# Patient Record
Sex: Male | Born: 1970 | Race: White | Hispanic: No | Marital: Married | Smoking: Never smoker
Health system: Southern US, Community
[De-identification: ages and names within clinical notes are randomized; demographics above are authoritative.]

---

## 2017-02-03 ENCOUNTER — Emergency Department (HOSPITAL_COMMUNITY): Payer: 59

## 2017-02-03 ENCOUNTER — Emergency Department (HOSPITAL_COMMUNITY)
Admission: EM | Admit: 2017-02-03 | Discharge: 2017-02-03 | Disposition: A | Discharge status: Home / Self Care | Payer: 59 | Attending: Emergency Medicine | Admitting: Emergency Medicine

## 2017-02-03 ENCOUNTER — Encounter (HOSPITAL_COMMUNITY): Payer: Self-pay | Admitting: Emergency Medicine

## 2017-02-03 DIAGNOSIS — R1011 Right upper quadrant pain: Secondary | ICD-10-CM | POA: Diagnosis present

## 2017-02-03 DIAGNOSIS — K8020 Calculus of gallbladder without cholecystitis without obstruction: Secondary | ICD-10-CM | POA: Diagnosis not present

## 2017-02-03 DIAGNOSIS — K8050 Calculus of bile duct without cholangitis or cholecystitis without obstruction: Secondary | ICD-10-CM | POA: Insufficient documentation

## 2017-02-03 LAB — CBC WITH DIFFERENTIAL/PLATELET
Basophils Absolute: 0 10*3/uL (ref 0.0–0.1)
Basophils Relative: 0 %
EOS PCT: 2 %
Eosinophils Absolute: 0.2 10*3/uL (ref 0.0–0.7)
HEMATOCRIT: 48.6 % (ref 39.0–52.0)
Hemoglobin: 17 g/dL (ref 13.0–17.0)
LYMPHS ABS: 2.4 10*3/uL (ref 0.7–4.0)
LYMPHS PCT: 25 %
MCH: 31.1 pg (ref 26.0–34.0)
MCHC: 35 g/dL (ref 30.0–36.0)
MCV: 89 fL (ref 78.0–100.0)
MONO ABS: 0.6 10*3/uL (ref 0.1–1.0)
MONOS PCT: 6 %
NEUTROS ABS: 6.5 10*3/uL (ref 1.7–7.7)
Neutrophils Relative %: 67 %
PLATELETS: 270 10*3/uL (ref 150–400)
RBC: 5.46 MIL/uL (ref 4.22–5.81)
RDW: 13.1 % (ref 11.5–15.5)
WBC: 9.6 10*3/uL (ref 4.0–10.5)

## 2017-02-03 LAB — COMPREHENSIVE METABOLIC PANEL
ALBUMIN: 4.5 g/dL (ref 3.5–5.0)
ALK PHOS: 59 U/L (ref 38–126)
ALT: 30 U/L (ref 17–63)
AST: 27 U/L (ref 15–41)
Anion gap: 13 (ref 5–15)
BILIRUBIN TOTAL: 0.6 mg/dL (ref 0.3–1.2)
BUN: 13 mg/dL (ref 6–20)
CHLORIDE: 102 mmol/L (ref 101–111)
CO2: 25 mmol/L (ref 22–32)
Calcium: 9 mg/dL (ref 8.9–10.3)
Creatinine, Ser: 1.03 mg/dL (ref 0.61–1.24)
GFR calc Af Amer: >60 mL/min (ref >60)
GFR calc non Af Amer: >60 mL/min (ref >60)
GLUCOSE: 108 mg/dL — AB (ref 65–99)
Potassium: 3.4 mmol/L — ABNORMAL LOW (ref 3.5–5.1)
SODIUM: 140 mmol/L (ref 135–145)
Total Protein: 7.4 g/dL (ref 6.5–8.1)

## 2017-02-03 LAB — LIPASE, BLOOD: Lipase: 44 U/L (ref 11–51)

## 2017-02-03 MED ORDER — ONDANSETRON 4 MG PO TBDP: 4.0000 mg | ORAL_TABLET | Freq: Three times a day (TID) | ORAL | 0 refills | Status: AC | PRN

## 2017-02-03 MED ORDER — HYDROCODONE-ACETAMINOPHEN 5-325 MG PO TABS: 1.0000 | ORAL_TABLET | ORAL | 0 refills | Status: AC | PRN

## 2017-02-03 MED ORDER — ONDANSETRON HCL 4 MG/2ML IJ SOLN
4.0000 mg | Freq: Once | INTRAMUSCULAR | Status: AC
  Administered 2017-02-03: 4 mg via INTRAVENOUS
  Filled 2017-02-03: qty 2

## 2017-02-03 MED ORDER — MORPHINE SULFATE (PF) 4 MG/ML IV SOLN
8.0000 mg | Freq: Once | INTRAVENOUS | Status: AC
  Administered 2017-02-03: 8 mg via INTRAVENOUS
  Filled 2017-02-03: qty 2

## 2017-02-03 NOTE — ED Provider Notes (Signed)
[facility]-EMERGENCY DEPT Provider Note   CSN: [id number] Arrival date & time: 02/03/17  5621     History   Chief Complaint Chief Complaint  Patient presents with  . Abdominal Pain    HPI Ward Boissonneault is a 46 y.o. male.  HPI  46 year old male presents with right upper quadrant and right-sided back pain. It woke him up about 2 hours ago out of sleep. He vomited twice. No further nausea and the pain is minimally improved. It feels like a tightness in his abdomen. Nothing makes it better or worse. It is not pleuritic. There is no chest pain or shortness of breath. No recent travel, history of DVT/PE, or leg swelling. He's never had issues with his gallbladder before. No other prior abdominal issues. Declines pain medicine at this time. No urinary symptoms. He states it does feel like a kidney stone except that it is not in his lower abdomen like it typically would be.  History reviewed. No pertinent past medical history.  There are no active problems to display for this patient.   History reviewed. No pertinent surgical history.     Home Medications    Prior to Admission medications   Medication Sig Start Date End Date Taking? Authorizing Provider  HYDROcodone-acetaminophen (NORCO) 5-325 MG tablet Take 1 tablet by mouth every 4 (four) hours as needed for severe pain. 02/03/17   Pricilla Loveless, MD  ondansetron (ZOFRAN ODT) 4 MG disintegrating tablet Take 1 tablet (4 mg total) by mouth every 8 (eight) hours as needed for nausea or vomiting. 02/03/17   Pricilla Loveless, MD    Family History History reviewed. No pertinent family history.  Social History Social History  Substance Use Topics  . Smoking status: Never Smoker  . Smokeless tobacco: Never Used  . Alcohol use No     Allergies   Patient has no known allergies.   Review of Systems Review of Systems  Constitutional: Negative for fever.  Respiratory: Negative for shortness of breath.   Cardiovascular: Negative for  chest pain.  Gastrointestinal: Positive for abdominal pain and vomiting.  Genitourinary: Negative for dysuria and hematuria.  Musculoskeletal: Positive for back pain.  All other systems reviewed and are negative.    Physical Exam Updated Vital Signs BP 138/88 (BP Location: Left Arm)   Pulse 80   Temp 98 F (36.7 C)   Resp 12   Ht  (1.803 m)   Wt 79.4 kg (175 lb)   SpO2 97%   BMI 24.41 kg/m   Physical Exam  Constitutional: He is oriented to person, place, and time. He appears well-developed and well-nourished.  HENT:  Head: Normocephalic and atraumatic.  Right Ear: External ear normal.  Left Ear: External ear normal.  Nose: Nose normal.  Eyes: Right eye exhibits no discharge. Left eye exhibits no discharge.  Neck: Neck supple.  Cardiovascular: Normal rate, regular rhythm and normal heart sounds.   Pulmonary/Chest: Effort normal and breath sounds normal.  Abdominal: Soft. There is tenderness (mild) in the right upper quadrant. There is negative Murphy's sign.    Musculoskeletal: He exhibits no edema.  Neurological: He is alert and oriented to person, place, and time.  Skin: Skin is warm and dry.  Nursing note and vitals reviewed.    ED Treatments / Results  Labs (all labs ordered are listed, but only abnormal results are displayed) Labs Reviewed  COMPREHENSIVE METABOLIC PANEL - Abnormal; Notable for the following:       Result Value   Potassium  3.4 (*)    Glucose, Bld 108 (*)    All other components within normal limits  LIPASE, BLOOD  CBC WITH DIFFERENTIAL/PLATELET  URINALYSIS, ROUTINE W REFLEX MICROSCOPIC    EKG  EKG Interpretation None       Radiology US Abdomen Limited Ruq  Result Date: 02/03/2017 CLINICAL DATA:  Right upper quadrant pain and emesis EXAM: ULTRASOUND ABDOMEN LIMITED RIGHT UPPER QUADRANT COMPARISON:  None. FINDINGS: Gallbladder: Multiple small stones layering in the gallbladder. Largest measures about 7 mm diameter. No  gallbladder wall thickening or edema. Murphy's sign is negative. Common bile duct: Diameter: 3.5 mm, normal Liver: Mildly increased parenchymal echotexture suggesting fatty infiltration. No focal lesions identified. Portal vein is patent on color Doppler imaging with normal direction of blood flow towards the liver. IMPRESSION: Cholelithiasis. No additional changes to suggest cholecystitis. Probable fatty infiltration of the liver. Electronically Signed   By: Burman Nieves M.D.   On: 02/03/2017 04:44    Procedures Procedures (including critical care time)  Medications Ordered in ED Medications  ondansetron (ZOFRAN) injection 4 mg (4 mg Intravenous Given 02/03/17 0418)  morphine 4 MG/ML injection 8 mg (8 mg Intravenous Given 02/03/17 0437)     Initial Impression / Assessment and Plan / ED Course  I have reviewed the triage vital signs and the nursing notes.  Pertinent labs & imaging results that were available during my care of the patient were reviewed by me and considered in my medical decision making (see chart for details).     Patient's pain is much better after a dose of IV morphine. His workup is consistent with cholelithiasis with biliary colic. However he is afebrile, normal white blood cell count, and no sonographic findings of cholecystitis. He appears stable for outpatient follow-up with his PCP and a general surgeon. He is from [place], thus will attend follow-up with his PCP for a general surgery referral. Discussed strict return precautions.  Final Clinical Impressions(s) / ED Diagnoses   Final diagnoses:  RUQ abdominal pain  Biliary colic  Calculus of gallbladder without cholecystitis without obstruction    New Prescriptions Discharge Medication List as of 02/03/2017  5:25 AM    START taking these medications   Details  HYDROcodone-acetaminophen (NORCO) 5-325 MG tablet Take 1 tablet by mouth every 4 (four) hours as needed for severe pain., Starting Sat 02/03/2017,  Print    ondansetron (ZOFRAN ODT) 4 MG disintegrating tablet Take 1 tablet (4 mg total) by mouth every 8 (eight) hours as needed for nausea or vomiting., Starting Sat 02/03/2017, Print         Pricilla Loveless, MD 02/03/17 0700

## 2018-06-18 IMAGING — US US ABDOMEN LIMITED
1 series · 14 of 25 positions shown · non-contrast
Comparison: None.

CLINICAL DATA: Right upper quadrant pain and emesis

EXAM:
ULTRASOUND ABDOMEN LIMITED RIGHT UPPER QUADRANT

[Series 1: us abdomen limited · 0.25mm/px · 14 of 52 slices shown]
[im 1/52]
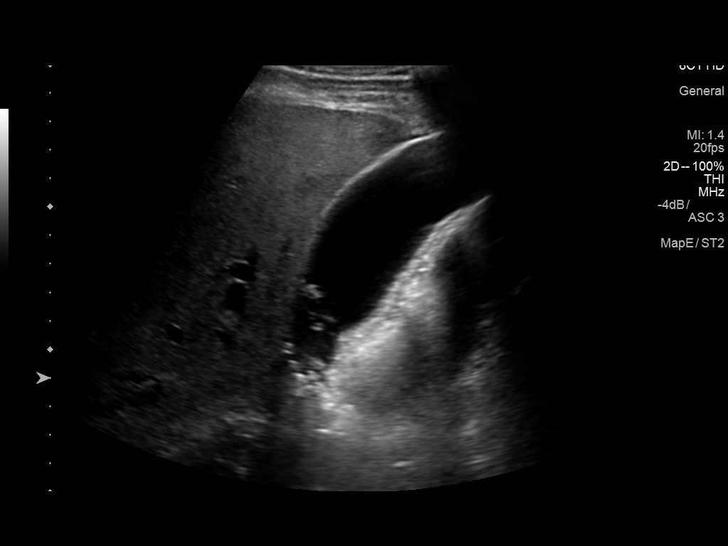
[im 5/52]
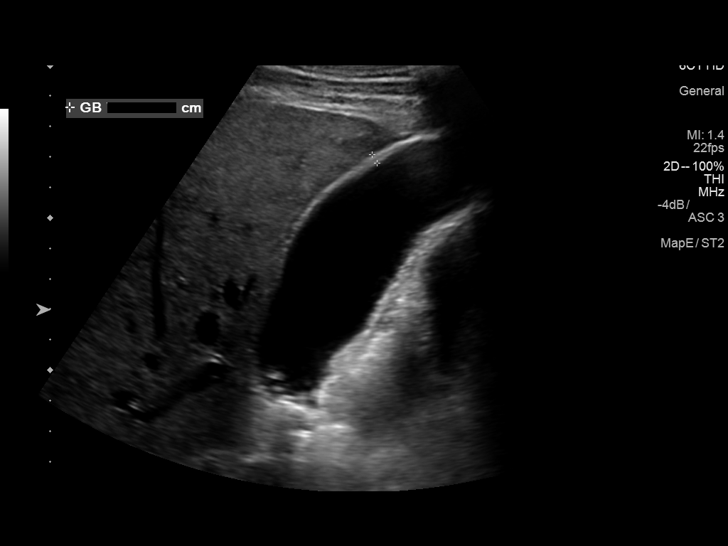
[im 9/52]
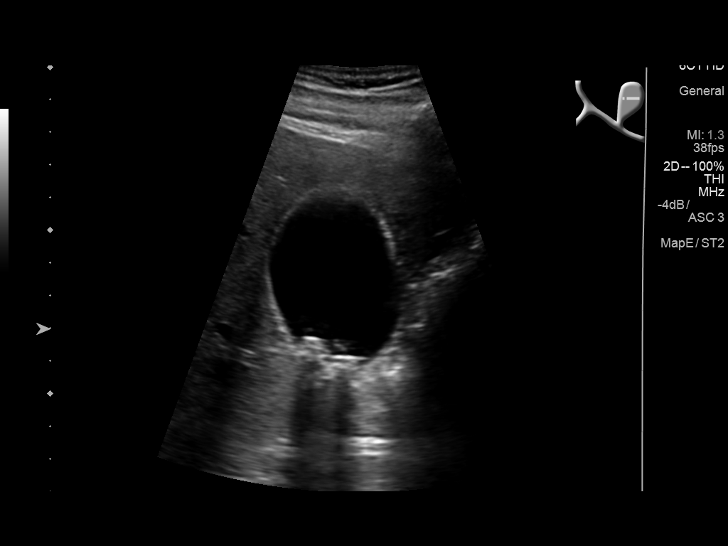
[im 13/52]
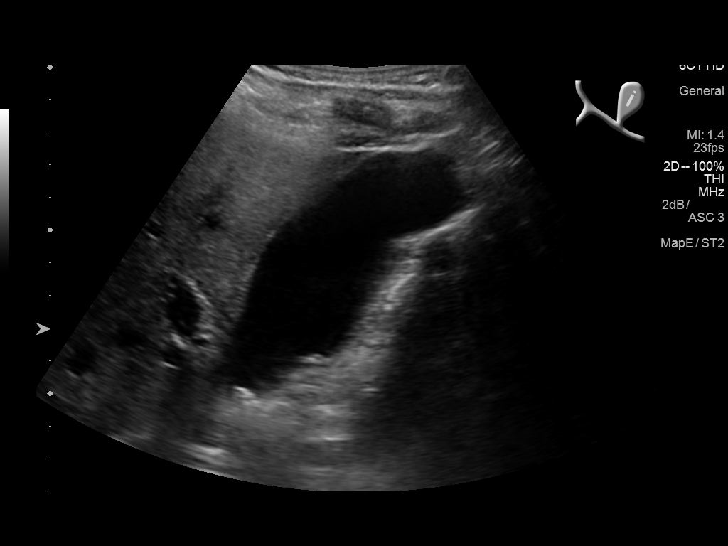
[im 18/52]
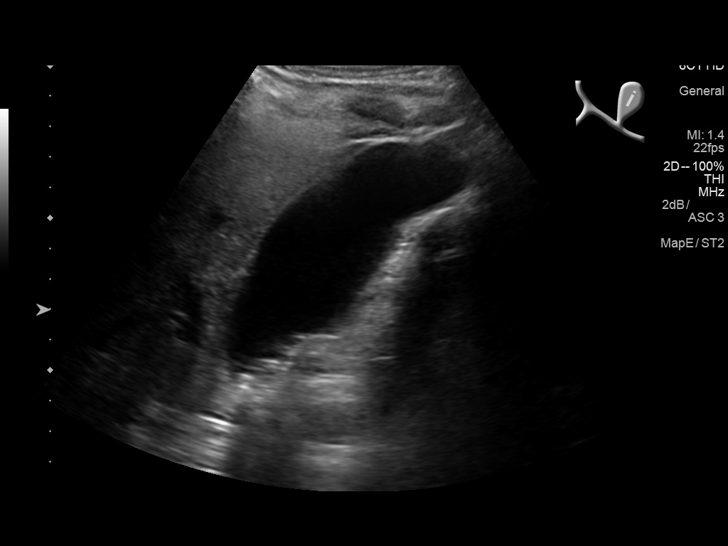
[im 20/52]
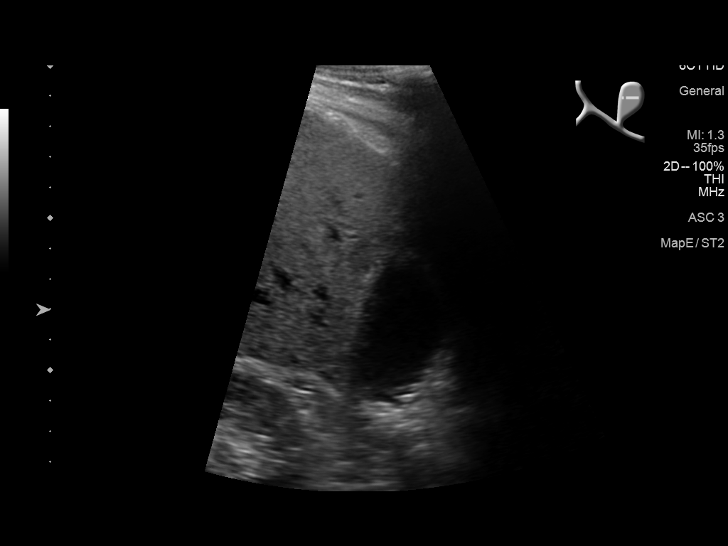
[im 24/52]
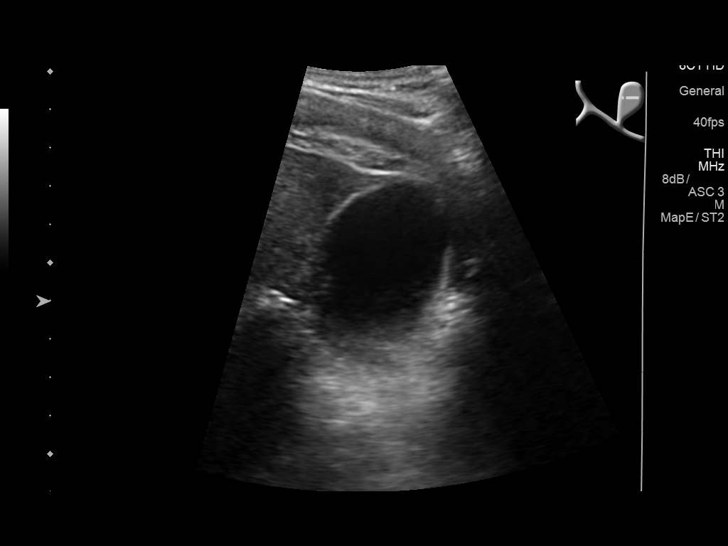
[im 28/52]
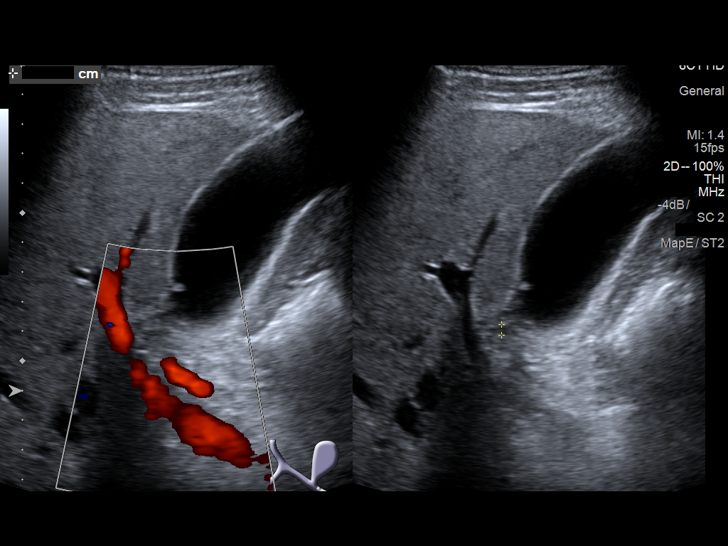
[im 32/52]
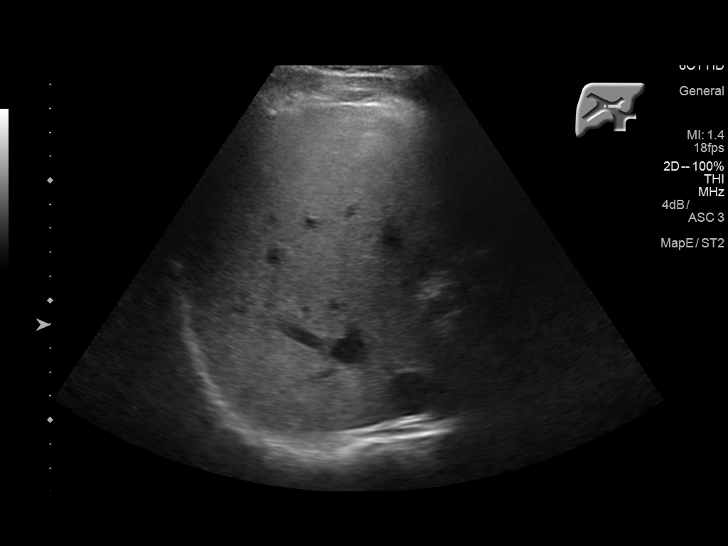
[im 35/52]
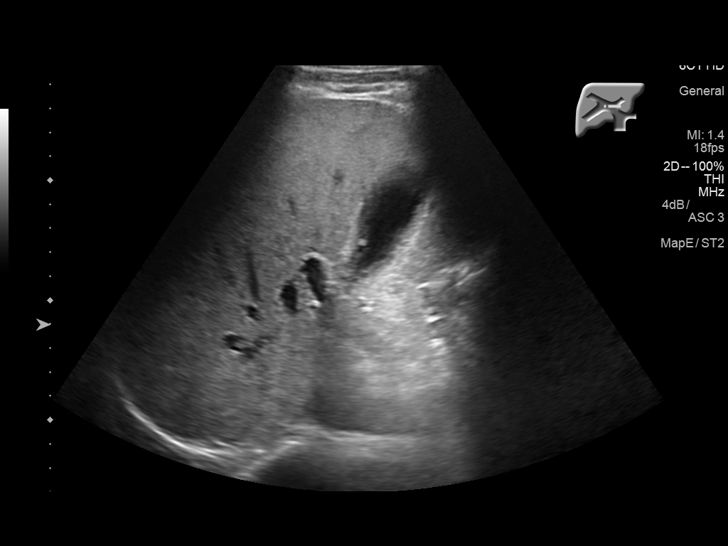
[im 39/52]
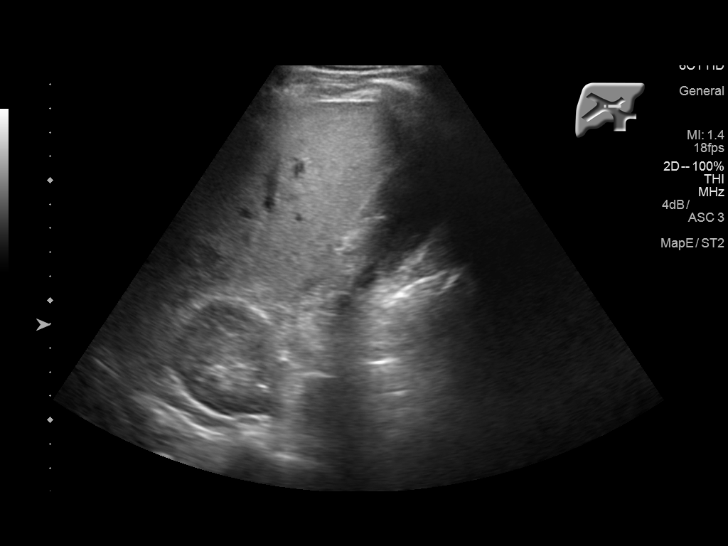
[im 43/52]
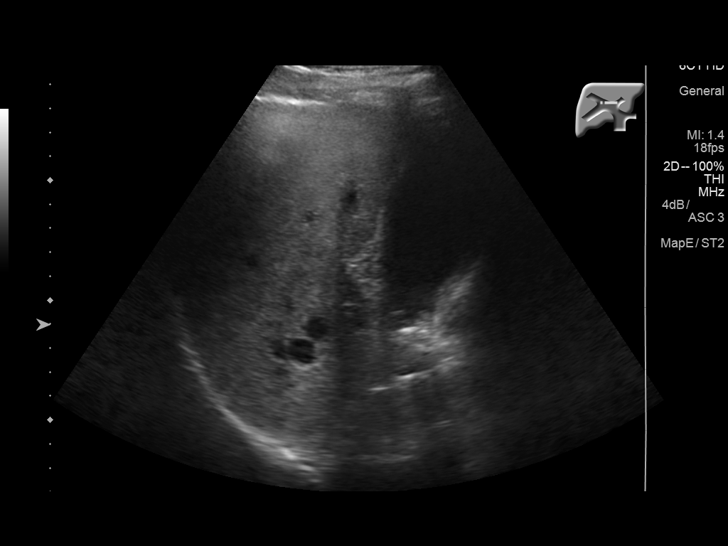
[im 47/52]
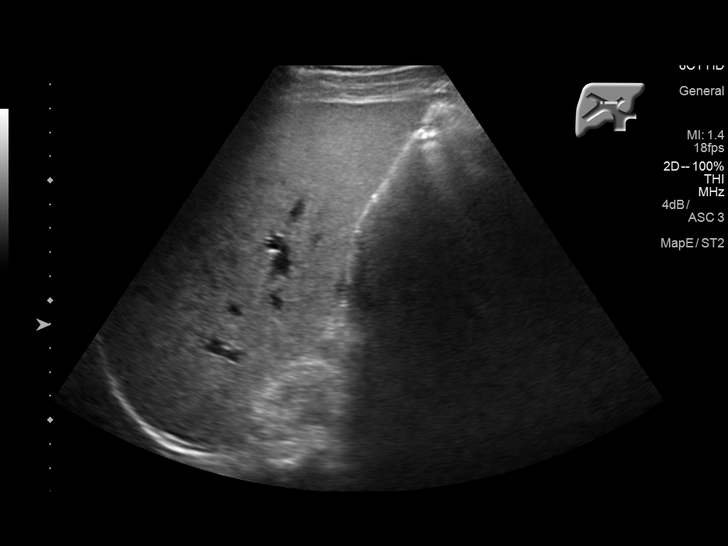
[im 52/52]
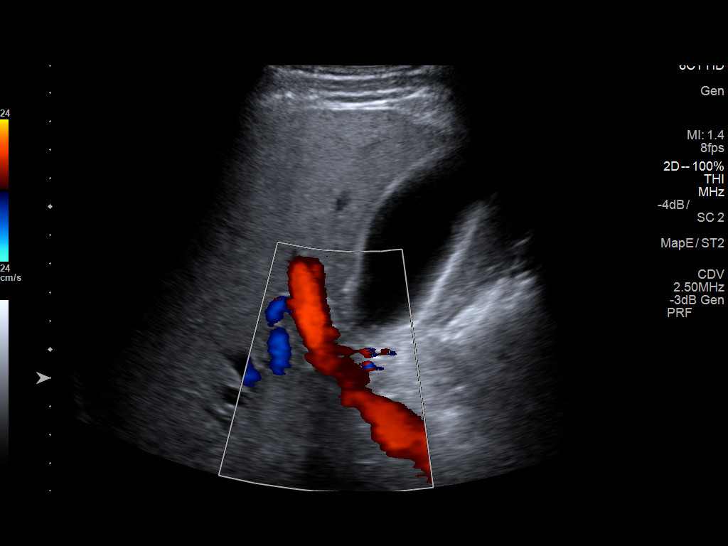

[14 of 25 positions shown; findings below may reference images not displayed]

FINDINGS: Gallbladder:

Multiple small stones layering in the gallbladder. Largest measures
about 7 mm diameter. No gallbladder wall thickening or edema.
Murphy's sign is negative.

Common bile duct:

Diameter: 3.5 mm, normal

Liver:

Mildly increased parenchymal echotexture suggesting fatty
infiltration. No focal lesions identified. Portal vein is patent on
color Doppler imaging with normal direction of blood flow towards
the liver.
IMPRESSION: Cholelithiasis. No additional changes to suggest cholecystitis.
Probable fatty infiltration of the liver.
